# Patient Record
Sex: Male | Born: 1995 | Race: Black or African American | Hispanic: No | Marital: Single
Health system: Southern US, Community
[De-identification: ages and names within clinical notes are randomized; demographics above are authoritative.]

## PROBLEM LIST (undated history)

## (undated) DIAGNOSIS — N390 Urinary tract infection, site not specified: Secondary | ICD-10-CM

## (undated) DIAGNOSIS — Z789 Other specified health status: Secondary | ICD-10-CM

## (undated) DIAGNOSIS — R0600 Dyspnea, unspecified: Secondary | ICD-10-CM

## (undated) HISTORY — PX: WISDOM TOOTH EXTRACTION: SHX21

## (undated) HISTORY — DX: Urinary tract infection, site not specified: N39.0

---
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
## 2015-02-27 ENCOUNTER — Emergency Department (HOSPITAL_COMMUNITY)
Admission: EM | Admit: 2015-02-27 | Discharge: 2015-02-27 | Disposition: A | Discharge status: Home / Self Care | Payer: 59 | Attending: Emergency Medicine | Admitting: Emergency Medicine

## 2015-02-27 ENCOUNTER — Encounter (HOSPITAL_COMMUNITY): Payer: Self-pay | Admitting: Emergency Medicine

## 2015-02-27 DIAGNOSIS — R369 Urethral discharge, unspecified: Secondary | ICD-10-CM | POA: Insufficient documentation

## 2015-02-27 DIAGNOSIS — N5082 Scrotal pain: Secondary | ICD-10-CM

## 2015-02-27 DIAGNOSIS — N508 Other specified disorders of male genital organs: Secondary | ICD-10-CM | POA: Insufficient documentation

## 2015-02-27 LAB — URINALYSIS, ROUTINE W REFLEX MICROSCOPIC
BILIRUBIN URINE: NEGATIVE
GLUCOSE, UA: NEGATIVE mg/dL
HGB URINE DIPSTICK: NEGATIVE
KETONES UR: NEGATIVE mg/dL
Nitrite: NEGATIVE
PH: 5.5 (ref 5.0–8.0)
PROTEIN: NEGATIVE mg/dL
Specific Gravity, Urine: 1.015 (ref 1.005–1.030)
UROBILINOGEN UA: 1 mg/dL (ref 0.0–1.0)

## 2015-02-27 LAB — URINE MICROSCOPIC-ADD ON

## 2015-02-27 MED ORDER — CEFTRIAXONE SODIUM 250 MG IJ SOLR
250.0000 mg | Freq: Once | INTRAMUSCULAR | Status: AC
  Administered 2015-02-27: 250 mg via INTRAMUSCULAR
  Filled 2015-02-27: qty 250

## 2015-02-27 MED ORDER — AZITHROMYCIN 250 MG PO TABS
1000.0000 mg | ORAL_TABLET | Freq: Once | ORAL | Status: AC
  Administered 2015-02-27: 1000 mg via ORAL
  Filled 2015-02-27: qty 4

## 2015-02-27 NOTE — Discharge Instructions (Signed)
As discussed, it is important that you monitor your condition carefully, and do not hesitate to return here, or 2 hours urologist if you develop new, or concerning pain.  Please recall that there are additional tests being conducted, and that you'll be made aware of abnormal results.  If these tests are abnormal, it is important she follow up with her primary care physician for further evaluation and management.

## 2015-02-27 NOTE — ED Notes (Signed)
Pt in from home for eval of bilateral testical pain x3 days and white penile discharge with dysuria. Pt denies any swelling to testicles and reports using protection when sexually active. nad noted. Pt denies any abd pain.

## 2015-02-27 NOTE — ED Provider Notes (Signed)
CSN: [id number]     Arrival date & time 02/27/15  1140 History   First MD Initiated Contact with Patient 02/27/15 1142     Chief Complaint  Patient presents with  . Testicle Pain  . Penile Discharge     (Consider location/radiation/quality/duration/timing/severity/associated sxs/prior Treatment) HPI Patient presents with concern of 3 days of left-sided testicles soreness, new dysuria, discharge. Onset was subtle. Since onset symptoms of been persistent. No scrotal swelling, pain is moderate. No fever, chills, abdominal pain, nausea, vomiting, hematuria. No history of STD. Patient has unprotected oral intercourse, but protected vaginal intercourse.    History reviewed. No pertinent past medical history. History reviewed. No pertinent past surgical history. No family history on file. History  Substance Use Topics  . Smoking status: Never Smoker   . Smokeless tobacco: Not on file  . Alcohol Use: Yes     Comment: social    Review of Systems  Constitutional:       Per HPI, otherwise negative  HENT:       Per HPI, otherwise negative  Respiratory:       Per HPI, otherwise negative  Cardiovascular:       Per HPI, otherwise negative  Gastrointestinal: Negative for vomiting.  Endocrine: Negative for polyuria.       Negative aside from HPI  Genitourinary:       Neg aside from HPI   Musculoskeletal:       Per HPI, otherwise negative  Skin: Negative for color change and wound.  Neurological: Negative for syncope.      Allergies  Review of patient's allergies indicates no known allergies.  Home Medications   Prior to Admission medications   Not on File   BP 132/73 mmHg  Pulse 73  Temp(Src) 98.9 F (37.2 C) (Oral)  Resp 16  Ht 5\' 8"  (1.727 m)  Wt 140 lb (63.504 kg)  BMI 21.29 kg/m2  SpO2 100% Physical Exam  Constitutional: He is oriented to person, place, and time. He appears well-developed. No distress.  HENT:  Head: Normocephalic and atraumatic.  Eyes:  Conjunctivae and EOM are normal.  Cardiovascular: Normal rate and regular rhythm.   Pulmonary/Chest: Effort normal. No stridor. No respiratory distress.  Abdominal: He exhibits no distension. Hernia confirmed negative in the right inguinal area and confirmed negative in the left inguinal area.  Genitourinary:    Right testis shows no mass, no swelling and no tenderness. Left testis shows no mass, no swelling and no tenderness. No penile tenderness. No discharge found.  Musculoskeletal: He exhibits no edema.  Lymphadenopathy:       Right: Inguinal adenopathy present.       Left: Inguinal adenopathy present.  Neurological: He is alert and oriented to person, place, and time.  Skin: Skin is warm and dry.  Psychiatric: He has a normal mood and affect.  Nursing note and vitals reviewed.   ED Course  Procedures (including critical care time) Labs Review Labs Reviewed  URINALYSIS, ROUTINE W REFLEX MICROSCOPIC  RPR  HIV ANTIBODY (ROUTINE TESTING)  GC/CHLAMYDIA PROBE AMP ([place])      MDM  Patient presents with new left-sided scrotum soreness, dysuria, discharge. Patient received empiric therapy for gonorrhea and chlamydia, has pending syphilis and HIV tests. No evidence for torsion given the absence of pain with palpation, distress, substantial discomfort. Given the passage of 3 days since onset, patient can follow-up with urology for further evaluation and management as well.    Gerhard Munchobert Deklynn Charlet, MD 02/27/15 [id number]

## 2015-02-28 LAB — RPR: RPR: NONREACTIVE

## 2015-03-01 LAB — GC/CHLAMYDIA PROBE AMP (~~LOC~~) NOT AT ARMC
Chlamydia: NEGATIVE
NEISSERIA GONORRHEA: NEGATIVE

## 2015-03-02 LAB — HIV ANTIBODY (ROUTINE TESTING W REFLEX): HIV SCREEN 4TH GENERATION: NONREACTIVE

## 2019-07-16 ENCOUNTER — Emergency Department (HOSPITAL_COMMUNITY)
Admission: EM | Admit: 2019-07-16 | Discharge: 2019-07-17 | Disposition: A | Discharge status: Home / Self Care | Payer: Self-pay | Attending: Emergency Medicine | Admitting: Emergency Medicine

## 2019-07-16 ENCOUNTER — Emergency Department (HOSPITAL_COMMUNITY): Payer: Self-pay

## 2019-07-16 ENCOUNTER — Other Ambulatory Visit: Payer: Self-pay

## 2019-07-16 ENCOUNTER — Encounter (HOSPITAL_COMMUNITY): Payer: Self-pay | Admitting: Emergency Medicine

## 2019-07-16 DIAGNOSIS — Y903 Blood alcohol level of 60-79 mg/100 ml: Secondary | ICD-10-CM | POA: Insufficient documentation

## 2019-07-16 DIAGNOSIS — Y939 Activity, unspecified: Secondary | ICD-10-CM | POA: Insufficient documentation

## 2019-07-16 DIAGNOSIS — Z20828 Contact with and (suspected) exposure to other viral communicable diseases: Secondary | ICD-10-CM | POA: Insufficient documentation

## 2019-07-16 DIAGNOSIS — Z23 Encounter for immunization: Secondary | ICD-10-CM | POA: Insufficient documentation

## 2019-07-16 DIAGNOSIS — Y929 Unspecified place or not applicable: Secondary | ICD-10-CM | POA: Insufficient documentation

## 2019-07-16 DIAGNOSIS — S41112A Laceration without foreign body of left upper arm, initial encounter: Secondary | ICD-10-CM | POA: Insufficient documentation

## 2019-07-16 DIAGNOSIS — R202 Paresthesia of skin: Secondary | ICD-10-CM | POA: Insufficient documentation

## 2019-07-16 DIAGNOSIS — S51812A Laceration without foreign body of left forearm, initial encounter: Secondary | ICD-10-CM | POA: Insufficient documentation

## 2019-07-16 DIAGNOSIS — E876 Hypokalemia: Secondary | ICD-10-CM | POA: Insufficient documentation

## 2019-07-16 DIAGNOSIS — R200 Anesthesia of skin: Secondary | ICD-10-CM | POA: Insufficient documentation

## 2019-07-16 DIAGNOSIS — S21212A Laceration without foreign body of left back wall of thorax without penetration into thoracic cavity, initial encounter: Secondary | ICD-10-CM | POA: Insufficient documentation

## 2019-07-16 DIAGNOSIS — F10920 Alcohol use, unspecified with intoxication, uncomplicated: Secondary | ICD-10-CM | POA: Insufficient documentation

## 2019-07-16 DIAGNOSIS — Y999 Unspecified external cause status: Secondary | ICD-10-CM | POA: Insufficient documentation

## 2019-07-16 HISTORY — DX: Other specified health status: Z78.9

## 2019-07-16 LAB — ETHANOL: Alcohol, Ethyl (B): 77 mg/dL — ABNORMAL HIGH (ref <10)

## 2019-07-16 LAB — I-STAT CHEM 8, ED
BUN: 9 mg/dL (ref 6–20)
Calcium, Ion: 1.16 mmol/L (ref 1.15–1.40)
Chloride: 106 mmol/L (ref 98–111)
Creatinine, Ser: 1.2 mg/dL (ref 0.61–1.24)
Glucose, Bld: 92 mg/dL (ref 70–99)
HCT: 47 % (ref 39.0–52.0)
Hemoglobin: 16 g/dL (ref 13.0–17.0)
Potassium: 2.9 mmol/L — ABNORMAL LOW (ref 3.5–5.1)
Sodium: 143 mmol/L (ref 135–145)
TCO2: 16 mmol/L — ABNORMAL LOW (ref 22–32)

## 2019-07-16 LAB — BPAM RBC
Blood Product Expiration Date: 202009302359
Blood Product Expiration Date: 202009302359
ISSUE DATE / TIME: 202008262002
ISSUE DATE / TIME: 202008262002
Unit Type and Rh: 5100
Unit Type and Rh: 5100

## 2019-07-16 LAB — ABO/RH: ABO/RH(D): O POS

## 2019-07-16 LAB — TYPE AND SCREEN
ABO/RH(D): O POS
Antibody Screen: NEGATIVE
Unit division: 0
Unit division: 0

## 2019-07-16 LAB — BPAM FFP
Blood Product Expiration Date: 202008312359
Blood Product Expiration Date: 202008312359
ISSUE DATE / TIME: 202008262002
ISSUE DATE / TIME: 202008262002
Unit Type and Rh: 6200
Unit Type and Rh: 6200

## 2019-07-16 LAB — COMPREHENSIVE METABOLIC PANEL
ALT: 14 U/L (ref 0–44)
AST: 21 U/L (ref 15–41)
Albumin: 4.4 g/dL (ref 3.5–5.0)
Alkaline Phosphatase: 58 U/L (ref 38–126)
Anion gap: 21 — ABNORMAL HIGH (ref 5–15)
BUN: 9 mg/dL (ref 6–20)
CO2: 14 mmol/L — ABNORMAL LOW (ref 22–32)
Calcium: 9.4 mg/dL (ref 8.9–10.3)
Chloride: 107 mmol/L (ref 98–111)
Creatinine, Ser: 1.4 mg/dL — ABNORMAL HIGH (ref 0.61–1.24)
GFR calc Af Amer: >60 mL/min (ref >60)
GFR calc non Af Amer: >60 mL/min (ref >60)
Glucose, Bld: 102 mg/dL — ABNORMAL HIGH (ref 70–99)
Potassium: 3.1 mmol/L — ABNORMAL LOW (ref 3.5–5.1)
Sodium: 142 mmol/L (ref 135–145)
Total Bilirubin: 0.8 mg/dL (ref 0.3–1.2)
Total Protein: 7.4 g/dL (ref 6.5–8.1)

## 2019-07-16 LAB — CBC
HCT: 43.4 % (ref 39.0–52.0)
Hemoglobin: 14.5 g/dL (ref 13.0–17.0)
MCH: 30.4 pg (ref 26.0–34.0)
MCHC: 33.4 g/dL (ref 30.0–36.0)
MCV: 91 fL (ref 80.0–100.0)
Platelets: 246 10*3/uL (ref 150–400)
RBC: 4.77 MIL/uL (ref 4.22–5.81)
RDW: 12.8 % (ref 11.5–15.5)
WBC: 8.7 10*3/uL (ref 4.0–10.5)
nRBC: 0 % (ref 0.0–0.2)

## 2019-07-16 LAB — PREPARE FRESH FROZEN PLASMA: Unit division: 0

## 2019-07-16 LAB — LACTIC ACID, PLASMA: Lactic Acid, Venous: >11 mmol/L (ref 0.5–1.9)

## 2019-07-16 LAB — CDS SEROLOGY

## 2019-07-16 LAB — PROTIME-INR
INR: 1.1 (ref 0.8–1.2)
Prothrombin Time: 13.6 seconds (ref 11.4–15.2)

## 2019-07-16 MED ORDER — IOHEXOL 300 MG/ML  SOLN
100.0000 mL | Freq: Once | INTRAMUSCULAR | Status: AC | PRN
  Administered 2019-07-16: 100 mL via INTRAVENOUS

## 2019-07-16 MED ORDER — LIDOCAINE-EPINEPHRINE (PF) 2 %-1:200000 IJ SOLN
INTRAMUSCULAR | Status: AC
  Administered 2019-07-16: 23:00:00
  Filled 2019-07-16: qty 20

## 2019-07-16 MED ORDER — SODIUM CHLORIDE 0.9 % IV BOLUS
2000.0000 mL | Freq: Once | INTRAVENOUS | Status: AC
  Administered 2019-07-16: 2000 mL via INTRAVENOUS

## 2019-07-16 MED ORDER — CEPHALEXIN 500 MG PO CAPS: 500.0000 mg | ORAL_CAPSULE | Freq: Four times a day (QID) | ORAL | 0 refills | Status: AC

## 2019-07-16 MED ORDER — TETANUS-DIPHTH-ACELL PERTUSSIS 5-2.5-18.5 LF-MCG/0.5 IM SUSP
0.5000 mL | Freq: Once | INTRAMUSCULAR | Status: AC
  Administered 2019-07-16: 0.5 mL via INTRAMUSCULAR
  Filled 2019-07-16: qty 0.5

## 2019-07-16 MED ORDER — POTASSIUM CHLORIDE 10 MEQ/100ML IV SOLN
10.0000 meq | INTRAVENOUS | Status: AC
  Administered 2019-07-16 (×2): 10 meq via INTRAVENOUS
  Filled 2019-07-16 (×2): qty 100

## 2019-07-16 MED ORDER — FENTANYL CITRATE (PF) 100 MCG/2ML IJ SOLN
50.0000 ug | Freq: Once | INTRAMUSCULAR | Status: AC
  Administered 2019-07-16: 21:00:00 50 ug via INTRAVENOUS
  Filled 2019-07-16: qty 2

## 2019-07-16 MED ORDER — CEFAZOLIN SODIUM-DEXTROSE 1-4 GM/50ML-% IV SOLN
INTRAVENOUS | Status: AC | PRN
  Administered 2019-07-16: 1 g via INTRAVENOUS
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
## 2019-07-16 NOTE — Discharge Instructions (Signed)
Please call Dr. Lequita Asal office tomorrow morning to be seen in his clinic tomorrow.  Please do not use your left arm until further instructed by his office.  Please keep the splint clean and dry.  Please return to ER if you develop severe pain, no significant swelling or skin discoloration in your arm, difficulty breathing or other new concerning symptoms.  Recommend antibiotic to help prevent infection.  Discussed this with your surgeon tomorrow.

## 2019-07-16 NOTE — Progress Notes (Signed)
Orthopedic Tech Progress Note Patient Details:  Franklin Sparks July 09, 1996 [id number]  Ortho Devices Type of Ortho Device: Arm sling, Volar splint Ortho Device/Splint Location: lue long volar. applied as requested by the dr. Manson Passey Device/Splint Interventions: Ordered, Application, Adjustment   Post Interventions Patient Tolerated: Well Instructions Provided: Care of device, Adjustment of device   Karolee Stamps 07/16/2019, 11:48 PM

## 2019-07-17 ENCOUNTER — Encounter (HOSPITAL_COMMUNITY): Payer: Self-pay | Admitting: Emergency Medicine

## 2019-07-17 LAB — SARS CORONAVIRUS 2 (TAT 6-24 HRS): SARS Coronavirus 2: NEGATIVE

## 2019-07-17 NOTE — ED Notes (Signed)
All appropriate discharge materials reviewed with patient at length. Time for questions provided. Pt denies any further questions at this time. Verbalizes understanding of all provided materials.  

## 2019-07-18 ENCOUNTER — Other Ambulatory Visit: Payer: Self-pay

## 2019-07-18 ENCOUNTER — Encounter (HOSPITAL_COMMUNITY): Payer: Self-pay | Admitting: *Deleted
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
## 2019-07-18 NOTE — Anesthesia Preprocedure Evaluation (Signed)
Anesthesia Evaluation  Patient identified by MRN, date of birth, ID band Patient awake    Reviewed: Allergy & Precautions, NPO status , Patient's Chart, lab work & pertinent test results  Airway Mallampati: II  TM Distance: >3 FB Neck ROM: Full    Dental no notable dental hx.    Pulmonary neg pulmonary ROS,    Pulmonary exam normal breath sounds clear to auscultation       Cardiovascular negative cardio ROS Normal cardiovascular exam Rhythm:Regular Rate:Normal     Neuro/Psych negative neurological ROS  negative psych ROS   GI/Hepatic negative GI ROS, Neg liver ROS,   Endo/Other  negative endocrine ROS  Renal/GU negative Renal ROS     Musculoskeletal negative musculoskeletal ROS (+)   Abdominal   Peds  Hematology negative hematology ROS (+)   Anesthesia Other Findings   Reproductive/Obstetrics                             Anesthesia Physical Anesthesia Plan  ASA: I  Anesthesia Plan: General   Post-op Pain Management:    Induction: Intravenous  PONV Risk Score and Plan: 3 and Ondansetron, Dexamethasone, Treatment may vary due to age or medical condition and Midazolam  Airway Management Planned: LMA  Additional Equipment: None  Intra-op Plan:   Post-operative Plan: Extubation in OR  Informed Consent: I have reviewed the patients History and Physical, chart, labs and discussed the procedure including the risks, benefits and alternatives for the proposed anesthesia with the patient or authorized representative who has indicated his/her understanding and acceptance.     Dental advisory given  Plan Discussed with: CRNA  Anesthesia Plan Comments:         Anesthesia Quick Evaluation

## 2019-07-19 ENCOUNTER — Encounter (HOSPITAL_COMMUNITY)
Admission: RE | Disposition: A | Discharge status: Home / Self Care | Payer: Self-pay | Source: Home / Self Care | Attending: Orthopedic Surgery

## 2019-07-19 ENCOUNTER — Encounter (HOSPITAL_COMMUNITY): Payer: Self-pay | Admitting: *Deleted

## 2019-07-19 ENCOUNTER — Ambulatory Visit (HOSPITAL_COMMUNITY): Payer: Self-pay | Admitting: Certified Registered"

## 2019-07-19 ENCOUNTER — Ambulatory Visit (HOSPITAL_COMMUNITY)
Admission: RE | Admit: 2019-07-19 | Discharge: 2019-07-19 | Disposition: A | Discharge status: Home / Self Care | Payer: Self-pay | Attending: Orthopedic Surgery | Admitting: Orthopedic Surgery

## 2019-07-19 DIAGNOSIS — S56424A Laceration of extensor muscle, fascia and tendon of left middle finger at forearm level, initial encounter: Secondary | ICD-10-CM | POA: Insufficient documentation

## 2019-07-19 DIAGNOSIS — S56422A Laceration of extensor muscle, fascia and tendon of left index finger at forearm level, initial encounter: Secondary | ICD-10-CM | POA: Insufficient documentation

## 2019-07-19 DIAGNOSIS — S51802A Unspecified open wound of left forearm, initial encounter: Secondary | ICD-10-CM

## 2019-07-19 DIAGNOSIS — Y939 Activity, unspecified: Secondary | ICD-10-CM | POA: Insufficient documentation

## 2019-07-19 DIAGNOSIS — S56426A Laceration of extensor muscle, fascia and tendon of left ring finger at forearm level, initial encounter: Secondary | ICD-10-CM | POA: Insufficient documentation

## 2019-07-19 DIAGNOSIS — S56522A Laceration of other extensor muscle, fascia and tendon at forearm level, left arm, initial encounter: Secondary | ICD-10-CM

## 2019-07-19 HISTORY — PX: WOUND EXPLORATION: SHX6188

## 2019-07-19 HISTORY — DX: Dyspnea, unspecified: R06.00

## 2019-07-19 SURGERY — WOUND EXPLORATION
Anesthesia: General | Laterality: Left

## 2019-07-19 MED ORDER — CEFAZOLIN SODIUM-DEXTROSE 2-4 GM/100ML-% IV SOLN
2.0000 g | INTRAVENOUS | Status: AC
  Administered 2019-07-19: 2 g via INTRAVENOUS

## 2019-07-19 MED ORDER — BUPIVACAINE-EPINEPHRINE (PF) 0.25% -1:200000 IJ SOLN
INTRAMUSCULAR | Status: AC
  Filled 2019-07-19: qty 30

## 2019-07-19 MED ORDER — BUPIVACAINE HCL (PF) 0.25 % IJ SOLN
INTRAMUSCULAR | Status: AC
  Filled 2019-07-19: qty 30

## 2019-07-19 MED ORDER — FENTANYL CITRATE (PF) 250 MCG/5ML IJ SOLN
INTRAMUSCULAR | Status: DC | PRN
  Administered 2019-07-19: 100 ug via INTRAVENOUS
  Administered 2019-07-19: 50 ug via INTRAVENOUS

## 2019-07-19 MED ORDER — PROPOFOL 10 MG/ML IV BOLUS
INTRAVENOUS | Status: DC | PRN
  Administered 2019-07-19: 200 mg via INTRAVENOUS

## 2019-07-19 MED ORDER — CEFAZOLIN SODIUM-DEXTROSE 2-4 GM/100ML-% IV SOLN
INTRAVENOUS | Status: AC
  Filled 2019-07-19: qty 100

## 2019-07-19 MED ORDER — ONDANSETRON HCL 4 MG/2ML IJ SOLN
INTRAMUSCULAR | Status: DC | PRN
  Administered 2019-07-19: 4 mg via INTRAVENOUS

## 2019-07-19 MED ORDER — BUPIVACAINE HCL 0.25 % IJ SOLN
INTRAMUSCULAR | Status: DC | PRN
  Administered 2019-07-19: 10 mL

## 2019-07-19 MED ORDER — DEXAMETHASONE SODIUM PHOSPHATE 10 MG/ML IJ SOLN
INTRAMUSCULAR | Status: DC | PRN
  Administered 2019-07-19: 10 mg via INTRAVENOUS

## 2019-07-19 MED ORDER — PROPOFOL 10 MG/ML IV BOLUS
INTRAVENOUS | Status: AC
  Filled 2019-07-19: qty 20

## 2019-07-19 MED ORDER — MIDAZOLAM HCL 2 MG/2ML IJ SOLN
INTRAMUSCULAR | Status: AC
  Filled 2019-07-19: qty 2

## 2019-07-19 MED ORDER — FENTANYL CITRATE (PF) 250 MCG/5ML IJ SOLN
INTRAMUSCULAR | Status: AC
  Filled 2019-07-19: qty 5

## 2019-07-19 MED ORDER — HYDROMORPHONE HCL 1 MG/ML IJ SOLN: 0.2500 mg | INTRAMUSCULAR | Status: DC | PRN

## 2019-07-19 MED ORDER — PROMETHAZINE HCL 25 MG/ML IJ SOLN: 6.2500 mg | INTRAMUSCULAR | Status: DC | PRN

## 2019-07-19 MED ORDER — DEXMEDETOMIDINE HCL 200 MCG/2ML IV SOLN
INTRAVENOUS | Status: DC | PRN
  Administered 2019-07-19 (×3): 8 ug via INTRAVENOUS
  Administered 2019-07-19 (×2): 4 ug via INTRAVENOUS

## 2019-07-19 MED ORDER — MEPERIDINE HCL 25 MG/ML IJ SOLN: 6.2500 mg | INTRAMUSCULAR | Status: DC | PRN

## 2019-07-19 MED ORDER — MIDAZOLAM HCL 2 MG/2ML IJ SOLN
INTRAMUSCULAR | Status: DC | PRN
  Administered 2019-07-19: 2 mg via INTRAVENOUS

## 2019-07-19 MED ORDER — LACTATED RINGERS IV SOLN
INTRAVENOUS | Status: DC
  Administered 2019-07-19: 07:00:00 via INTRAVENOUS

## 2019-07-19 MED ORDER — 0.9 % SODIUM CHLORIDE (POUR BTL) OPTIME
TOPICAL | Status: DC | PRN
  Administered 2019-07-19: 1000 mL

## 2019-07-19 MED ORDER — CHLORHEXIDINE GLUCONATE 4 % EX LIQD: 60.0000 mL | Freq: Once | CUTANEOUS | Status: DC

## 2019-07-19 MED ORDER — LIDOCAINE 2% (20 MG/ML) 5 ML SYRINGE
INTRAMUSCULAR | Status: DC | PRN
  Administered 2019-07-19: 100 mg via INTRAVENOUS

## 2019-07-19 MED ORDER — BUPIVACAINE HCL 0.5 % IJ SOLN
INTRAMUSCULAR | Status: AC
  Filled 2019-07-19: qty 1

## 2019-07-19 MED ORDER — PHENYLEPHRINE HCL (PRESSORS) 10 MG/ML IV SOLN
INTRAVENOUS | Status: DC | PRN
  Administered 2019-07-19 (×3): 80 ug via INTRAVENOUS

## 2019-07-19 SURGICAL SUPPLY — 32 items
BNDG ELASTIC 4X5.8 VLCR STR LF (GAUZE/BANDAGES/DRESSINGS) ×3 IMPLANT
BNDG GAUZE ELAST 4 BULKY (GAUZE/BANDAGES/DRESSINGS) ×3 IMPLANT
COVER WAND RF STERILE (DRAPES) IMPLANT
CUFF TOURN SGL QUICK 18X4 (TOURNIQUET CUFF) ×3 IMPLANT
DRAPE SURG 17X11 SM STRL (DRAPES) ×3 IMPLANT
DRSG ADAPTIC 3X8 NADH LF (GAUZE/BANDAGES/DRESSINGS) ×3 IMPLANT
DRSG PAD ABDOMINAL 8X10 ST (GAUZE/BANDAGES/DRESSINGS) ×3 IMPLANT
ELECT REM PT RETURN 9FT ADLT (ELECTROSURGICAL) ×3
ELECTRODE REM PT RTRN 9FT ADLT (ELECTROSURGICAL) ×1 IMPLANT
GAUZE SPONGE 4X4 12PLY STRL (GAUZE/BANDAGES/DRESSINGS) ×3 IMPLANT
GLOVE SURG ORTHO 8.0 STRL STRW (GLOVE) ×3 IMPLANT
GOWN STRL REUS W/ TWL XL LVL3 (GOWN DISPOSABLE) ×1 IMPLANT
GOWN STRL REUS W/TWL XL LVL3 (GOWN DISPOSABLE) ×2
IV NS IRRIG 3000ML ARTHROMATIC (IV SOLUTION) IMPLANT
KIT BASIN OR (CUSTOM PROCEDURE TRAY) ×3 IMPLANT
MANIFOLD NEPTUNE II (INSTRUMENTS) IMPLANT
PACK ORTHO EXTREMITY (CUSTOM PROCEDURE TRAY) ×3 IMPLANT
PAD CAST 4YDX4 CTTN HI CHSV (CAST SUPPLIES) ×1 IMPLANT
PADDING CAST COTTON 4X4 STRL (CAST SUPPLIES) ×2
SET CYSTO W/LG BORE CLAMP LF (SET/KITS/TRAYS/PACK) ×3 IMPLANT
SOAP 2 % CHG 4 OZ (WOUND CARE) ×3 IMPLANT
SPLINT FIBERGLASS 3X35 (CAST SUPPLIES) ×3 IMPLANT
SUT MNCRL AB 3-0 PS2 18 (SUTURE) ×3 IMPLANT
SUT PROLENE 3 0 PS 2 (SUTURE) ×6 IMPLANT
SUT VIC AB 1 CT1 27 (SUTURE)
SUT VIC AB 1 CT1 27XBRD ANTBC (SUTURE) IMPLANT
SUT VIC AB 2-0 CT1 27 (SUTURE)
SUT VIC AB 2-0 CT1 27XBRD (SUTURE) IMPLANT
SUT VIC AB 2-0 FS1 27 (SUTURE) ×6 IMPLANT
SYR 20ML LL LF (SYRINGE) IMPLANT
SYR CONTROL 10ML LL (SYRINGE) ×6 IMPLANT
TOWEL GREEN STERILE (TOWEL DISPOSABLE) ×3 IMPLANT

## 2019-07-19 NOTE — Anesthesia Postprocedure Evaluation (Signed)
Anesthesia Post Note  Patient: Franklin Sparks  Procedure(s) Performed: LEFT FOREARM WOUND EXPLORATION AND TENDON REPAIR (Left )     Patient location during evaluation: PACU Anesthesia Type: General Level of consciousness: sedated and patient cooperative Pain management: pain level controlled Vital Signs Assessment: post-procedure vital signs reviewed and stable Respiratory status: spontaneous breathing Cardiovascular status: stable Anesthetic complications: no    Last Vitals:  Vitals:   07/19/19 0919 07/19/19 0933  BP: 120/61 137/76  Pulse: 72 78  Resp: 20 15  Temp:  36.4 C  SpO2: 97% 96%    Last Pain:  Vitals:   07/19/19 0933  TempSrc:   PainSc: 0-No pain                 Nolon Nations
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
## 2019-07-19 NOTE — Discharge Instructions (Signed)
KEEP BANDAGE CLEAN AND DRY °CALL OFFICE FOR F/U APPT [phone] °KEEP HAND ELEVATED ABOVE HEART °OK TO APPLY ICE TO OPERATIVE AREA °CONTACT OFFICE IF ANY WORSENING PAIN OR CONCERNS. °

## 2019-07-19 NOTE — Transfer of Care (Signed)
Immediate Anesthesia Transfer of Care Note  Patient: Franklin Sparks  Procedure(s) Performed: LEFT FOREARM WOUND EXPLORATION AND TENDON REPAIR (Left )  Patient Location: PACU  Anesthesia Type:General  Level of Consciousness: awake and oriented  Airway & Oxygen Therapy: Patient Spontanous Breathing and Patient connected to nasal cannula oxygen  Post-op Assessment: Report given to RN and Post -op Vital signs reviewed and stable  Post vital signs: Reviewed and stable  Last Vitals:  Vitals Value Taken Time  BP 112/53 07/19/19 0849  Temp    Pulse 78 07/19/19 0858  Resp 29 07/19/19 0858  SpO2 96 % 07/19/19 0858  Vitals shown include unvalidated device data.  Last Pain:  Vitals:   07/19/19 0627  TempSrc: Oral  PainSc: 5       Patients Stated Pain Goal: 5 (21/30/86 5784)  Complications: No apparent anesthesia complications

## 2019-07-19 NOTE — Anesthesia Procedure Notes (Signed)
Procedure Name: LMA Insertion Date/Time: 07/19/2019 7:45 AM Performed by: Clearnce Sorrel, CRNA Pre-anesthesia Checklist: Patient identified, Emergency Drugs available, Suction available, Patient being monitored and Timeout performed Patient Re-evaluated:Patient Re-evaluated prior to induction Oxygen Delivery Method: Circle system utilized Preoxygenation: Pre-oxygenation with 100% oxygen Induction Type: IV induction LMA: LMA inserted LMA Size: 4.0 Number of attempts: 1 Placement Confirmation: positive ETCO2 and breath sounds checked- equal and bilateral Tube secured with: Tape Dental Injury: Teeth and Oropharynx as per pre-operative assessment

## 2019-07-20 ENCOUNTER — Encounter (HOSPITAL_COMMUNITY): Payer: Self-pay | Admitting: Orthopedic Surgery
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
## 2021-10-27 ENCOUNTER — Encounter (HOSPITAL_COMMUNITY): Payer: Self-pay | Admitting: *Deleted

## 2021-10-27 ENCOUNTER — Emergency Department (HOSPITAL_COMMUNITY)
Admission: EM | Admit: 2021-10-27 | Discharge: 2021-10-27 | Disposition: A | Discharge status: Home / Self Care | Payer: Medicaid Other | Attending: Emergency Medicine | Admitting: Emergency Medicine

## 2021-10-27 DIAGNOSIS — Z20822 Contact with and (suspected) exposure to covid-19: Secondary | ICD-10-CM | POA: Insufficient documentation

## 2021-10-27 DIAGNOSIS — Z87891 Personal history of nicotine dependence: Secondary | ICD-10-CM | POA: Insufficient documentation

## 2021-10-27 DIAGNOSIS — J101 Influenza due to other identified influenza virus with other respiratory manifestations: Secondary | ICD-10-CM | POA: Insufficient documentation

## 2021-10-27 LAB — RESP PANEL BY RT-PCR (FLU A&B, COVID) ARPGX2
Influenza A by PCR: POSITIVE — AB
Influenza B by PCR: NEGATIVE
SARS Coronavirus 2 by RT PCR: NEGATIVE

## 2021-10-27 NOTE — ED Notes (Signed)
ED Provider at bedside. 

## 2021-10-27 NOTE — ED Triage Notes (Signed)
Flu symptoms x 3 days
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
## 2022-07-03 ENCOUNTER — Emergency Department (HOSPITAL_COMMUNITY)
Admission: EM | Admit: 2022-07-03 | Discharge: 2022-07-03 | Disposition: A | Discharge status: Home / Self Care | Payer: Self-pay | Attending: Emergency Medicine | Admitting: Emergency Medicine

## 2022-07-03 ENCOUNTER — Other Ambulatory Visit: Payer: Self-pay

## 2022-07-03 ENCOUNTER — Encounter (HOSPITAL_COMMUNITY): Payer: Self-pay | Admitting: Emergency Medicine

## 2022-07-03 DIAGNOSIS — N4889 Other specified disorders of penis: Secondary | ICD-10-CM | POA: Insufficient documentation

## 2022-07-03 MED ORDER — DOXYCYCLINE HYCLATE 100 MG PO CAPS: 100.0000 mg | ORAL_CAPSULE | Freq: Two times a day (BID) | ORAL | 0 refills | Status: DC

## 2022-07-03 NOTE — ED Triage Notes (Signed)
Pt has bump to head of penis that's has open and drained, causing pain.

## 2022-07-03 NOTE — Discharge Instructions (Signed)
Please return to the ED with any new symptoms such as painful urination, discharge from the penis, fevers Please begin taking antibiotics I placed you on.  You will take these for the next 5 days twice daily. Please follow-up with your PCP and please continue to use safe sex practices.

## 2022-07-03 NOTE — ED Provider Notes (Signed)
[facility] EMERGENCY DEPARTMENT Provider Note   CSN: [id number] Arrival date & time: 07/03/22  1339     History  Chief Complaint  Patient presents with   Abscess    Franklin Sparks is a 26 y.o. male with medical history significant for dyspnea.  The patient presents to the ED for evaluation of abscess to his penis.  The patient states that ever since he was born, ever since he could remember, he has had a cyst/abscess on the side of his penis.  The patient reports that for the last 27 years the cyst/abscess does not cause any issues for him.  The patient states that he has been seen for this issue before by other doctors as a kid, was told not to worry about it.  The patient states that 2 days ago this area on his penis opened up and began draining a clear fluid.  Patient states since this time the area on his penis has become progressively more painful.  The patient states that he has been monogamous relationship and has been sober for 5 years.  The patient states that his partner is not experiencing any symptoms.  The patient denies any penile discharge, fevers, dysuria, nausea or vomiting, testicular or scrotal pain.   Abscess Associated symptoms: no fever, no nausea and no vomiting        Home Medications Prior to Admission medications   Medication Sig Start Date End Date Taking? Authorizing Provider  doxycycline (VIBRAMYCIN) 100 MG capsule Take 1 capsule (100 mg total) by mouth 2 (two) times daily. 07/03/22  Yes Al Decant, PA-C      Allergies    Patient has no known allergies.    Review of Systems   Review of Systems  Constitutional:  Negative for fever.  Gastrointestinal:  Negative for nausea and vomiting.  Genitourinary:  Negative for dysuria, penile discharge, penile pain, scrotal swelling and testicular pain.  Skin:  Positive for wound.  All other systems reviewed and are negative.   Physical Exam Updated Vital Signs BP (!) 145/103   Pulse 67    Temp 98.6 F (37 C) (Oral)   Resp 17   Ht 5\' 8"  (1.727 m)   Wt 65.8 kg   SpO2 100%   BMI 22.05 kg/m  Physical Exam Vitals and nursing note reviewed. Exam conducted with a chaperone present.  Constitutional:      General: He is not in acute distress.    Appearance: Normal appearance. He is not ill-appearing, toxic-appearing or diaphoretic.  HENT:     Head: Normocephalic and atraumatic.     Nose: Nose normal. No congestion.     Mouth/Throat:     Mouth: Mucous membranes are moist.     Pharynx: Oropharynx is clear.  Eyes:     Extraocular Movements: Extraocular movements intact.     Conjunctiva/sclera: Conjunctivae normal.     Pupils: Pupils are equal, round, and reactive to light.  Cardiovascular:     Rate and Rhythm: Normal rate and regular rhythm.  Pulmonary:     Effort: Pulmonary effort is normal.     Breath sounds: Normal breath sounds. No wheezing.  Abdominal:     General: Abdomen is flat. Bowel sounds are normal.     Palpations: Abdomen is soft.     Tenderness: There is no abdominal tenderness.  Genitourinary:   Musculoskeletal:     Cervical back: Normal range of motion and neck supple. No tenderness.  Skin:    General:  Skin is warm and dry.     Capillary Refill: Capillary refill takes less than 2 seconds.  Neurological:     Mental Status: He is alert and oriented to person, place, and time.     ED Results / Procedures / Treatments   Labs (all labs ordered are listed, but only abnormal results are displayed) Labs Reviewed - No data to display  EKG None  Radiology No results found.  Procedures Procedures   Medications Ordered in ED Medications - No data to display  ED Course/ Medical Decision Making/ A&P                           Medical Decision Making  26 year old male presents to the ED for evaluation.  Please see HPI for further details.  On examination, patient is a 1 cm nonfluctuating area without erythema or induration to the lateral  portion of the left side of his penis.  There is a sinus hole present without drainage.  The area in question is not tender.  The patient denies any concern for STI, denies any recent new sexual exposures.  The patient states that this area has been present on his penis since he was born.  Due to the fact this area already drained spontaneously, there is no incision and drainage needed.  This area does not appear to be a primary chancre, doubt syphilis.  The patient denies any dysuria, penile discharge, new sexual contacts so I doubt STI at this time.  Due to the fact that this patient states that this area has been present on his penis since he was born, low concern for STI.  Patient will be placed on 5 days of doxycycline twice daily and advised to follow-up with his PCP.  Patient advised to return for any new symptoms such as penile discharge, dysuria, new lesions.  Patient voices understanding of these instructions.  Patient had all of his questions answered to his satisfaction prior to discharge.  The patient is stable at this time for discharge home.  Final Clinical Impression(s) / ED Diagnoses Final diagnoses:  Penile cyst    Rx / DC Orders ED Discharge Orders          Ordered    doxycycline (VIBRAMYCIN) 100 MG capsule  2 times daily        07/03/22 1808              Al Decant, PA-C 07/03/22 1808    Gloris Manchester, MD 07/06/22 [phone]

## 2022-07-27 ENCOUNTER — Encounter (HOSPITAL_COMMUNITY): Payer: Self-pay | Admitting: Emergency Medicine

## 2022-07-27 ENCOUNTER — Other Ambulatory Visit: Payer: Self-pay

## 2022-07-27 ENCOUNTER — Emergency Department (HOSPITAL_COMMUNITY)
Admission: EM | Admit: 2022-07-27 | Discharge: 2022-07-27 | Disposition: A | Discharge status: Home / Self Care | Payer: Self-pay | Attending: Emergency Medicine | Admitting: Emergency Medicine

## 2022-07-27 DIAGNOSIS — R369 Urethral discharge, unspecified: Secondary | ICD-10-CM | POA: Insufficient documentation

## 2022-07-27 LAB — URINALYSIS, ROUTINE W REFLEX MICROSCOPIC
Bilirubin Urine: NEGATIVE
Glucose, UA: NEGATIVE mg/dL
Hgb urine dipstick: NEGATIVE
Ketones, ur: NEGATIVE mg/dL
Nitrite: NEGATIVE
Protein, ur: NEGATIVE mg/dL
Specific Gravity, Urine: 1.016 (ref 1.005–1.030)
pH: 7 (ref 5.0–8.0)

## 2022-07-27 MED ORDER — LIDOCAINE HCL (PF) 1 % IJ SOLN
INTRAMUSCULAR | Status: AC
  Administered 2022-07-27: 1 mL via INTRAMUSCULAR
  Filled 2022-07-27: qty 2

## 2022-07-27 MED ORDER — DOXYCYCLINE HYCLATE 100 MG PO CAPS: 100.0000 mg | ORAL_CAPSULE | Freq: Two times a day (BID) | ORAL | 0 refills | Status: AC

## 2022-07-27 MED ORDER — CEFTRIAXONE SODIUM 500 MG IJ SOLR
500.0000 mg | Freq: Once | INTRAMUSCULAR | Status: AC
  Administered 2022-07-27: 500 mg via INTRAMUSCULAR
  Filled 2022-07-27: qty 500

## 2022-07-27 NOTE — ED Provider Notes (Signed)
[facility] EMERGENCY DEPARTMENT Provider Note   CSN: [id number] Arrival date & time: 07/27/22  1634     History  Chief Complaint  Patient presents with   Groin Swelling    Franklin Sparks is a 26 y.o. male.  26 year old male who is sexually active presents today for evaluation of lesion to his penis.  This was present last week when he was evaluated in the emergency room.  Now he is also having penile discharge.  He is sexually active most recently 1 month ago.  States he was unable to fully complete the doxycycline course that was prescribed to him.  Denies testicular pain, scrotal swelling, penile pain, dysuria.  The history is provided by the patient. No language interpreter was used.       Home Medications Prior to Admission medications   Medication Sig Start Date End Date Taking? Authorizing Provider  doxycycline (VIBRAMYCIN) 100 MG capsule Take 1 capsule (100 mg total) by mouth 2 (two) times daily. 07/03/22   Al Decant, PA-C      Allergies    Patient has no known allergies.    Review of Systems   Review of Systems  Constitutional:  Negative for fever.  Genitourinary:  Positive for penile discharge. Negative for dysuria, flank pain, penile pain, penile swelling, scrotal swelling and testicular pain.  All other systems reviewed and are negative.   Physical Exam Updated Vital Signs BP 119/84 (BP Location: Left Arm)   Pulse 62   Temp 98 F (36.7 C) (Oral)   Resp 19   Ht 5\' 8"  (1.727 m)   Wt 65.8 kg   SpO2 100%   BMI 22.06 kg/m  Physical Exam Vitals and nursing note reviewed. Exam conducted with a chaperone present.     ED Results / Procedures / Treatments   Labs (all labs ordered are listed, but only abnormal results are displayed) Labs Reviewed  URINALYSIS, ROUTINE W REFLEX MICROSCOPIC  GC/CHLAMYDIA PROBE AMP ([place]) NOT AT [facility]    EKG None  Radiology No results found.  Procedures Procedures    Medications Ordered in  ED Medications  cefTRIAXone (ROCEPHIN) injection 500 mg (has no administration in time range)    ED Course/ Medical Decision Making/ A&P                           Medical Decision Making Amount and/or Complexity of Data Reviewed Labs: ordered.  Risk Prescription drug management.   26 year old male returns today for persistent penile discharge.  He was treated for a cyst on his penis last week with 5-day course of doxycycline.  He states he had improvement initially however he had emesis and could not complete the course.  He states he had 2 days left.  He did take 2 additional days following the emesis without additional difficulty tolerating the antibiotic.  He states he gave 3-4 additional days after completing the antibiotic without resolution of his symptoms.  Denies any new lesions, without testicular pain, or scrotal swelling.  Will obtain UA, gonorrhea and chlamydia.  Will empirically treat for STI.  Patient is in agreement with this.  Return precautions discussed.  Discussed follow-up with PCP. Due to lab experiencing staff shortage.  UA has not resulted yet.  We will follow-up on this but patient is appropriate for discharge.  Follow-up information for , health and wellness clinic provided.  UA with leukocytes and 21-50 WBCs.  Will order urine culture.  Given he is without dysuria will not treat for UTI.   Final Clinical Impression(s) / ED Diagnoses Final diagnoses:  Penile discharge    Rx / DC Orders ED Discharge Orders          Ordered    doxycycline (VIBRAMYCIN) 100 MG capsule  2 times daily        07/27/22 2143              Marita [place], PA-C 07/27/22 2242    Mardene Sayer, MD 07/28/22 [phone]

## 2022-07-27 NOTE — Discharge Instructions (Signed)
We obtained a urine sample today.  We also collected gonorrhea chlamydia.  These will take 2 to 3 days to result.  Received dose of antibiotic in the emergency room.  I have sent additional robotic into the pharmacy for you.  For any concerning symptoms return to the emergency room if you do not have a primary care provider have attached information for you above to establish care with [facility].

## 2022-07-27 NOTE — ED Triage Notes (Signed)
Pt presents with bump to penis, was treated at this ED, but ABT made him vomit.

## 2022-07-29 LAB — URINE CULTURE: Culture: NO GROWTH

## 2022-07-31 LAB — GC/CHLAMYDIA PROBE AMP (~~LOC~~) NOT AT ARMC
Chlamydia: NEGATIVE
Comment: NEGATIVE
Comment: NORMAL
Neisseria Gonorrhea: NEGATIVE

## 2022-09-12 ENCOUNTER — Other Ambulatory Visit: Payer: Self-pay

## 2022-09-12 ENCOUNTER — Emergency Department (HOSPITAL_COMMUNITY)
Admission: EM | Admit: 2022-09-12 | Discharge: 2022-09-12 | Disposition: A | Discharge status: Home / Self Care | Payer: Self-pay | Attending: Emergency Medicine | Admitting: Emergency Medicine

## 2022-09-12 ENCOUNTER — Encounter (HOSPITAL_COMMUNITY): Payer: Self-pay | Admitting: Emergency Medicine

## 2022-09-12 DIAGNOSIS — R369 Urethral discharge, unspecified: Secondary | ICD-10-CM | POA: Insufficient documentation

## 2022-09-12 DIAGNOSIS — R3911 Hesitancy of micturition: Secondary | ICD-10-CM | POA: Insufficient documentation

## 2022-09-12 DIAGNOSIS — R350 Frequency of micturition: Secondary | ICD-10-CM | POA: Insufficient documentation

## 2022-09-12 LAB — URINALYSIS, ROUTINE W REFLEX MICROSCOPIC
Bacteria, UA: NONE SEEN
Bilirubin Urine: NEGATIVE
Glucose, UA: NEGATIVE mg/dL
Hgb urine dipstick: NEGATIVE
Ketones, ur: NEGATIVE mg/dL
Nitrite: NEGATIVE
Protein, ur: NEGATIVE mg/dL
Specific Gravity, Urine: 1.012 (ref 1.005–1.030)
pH: 6 (ref 5.0–8.0)

## 2022-09-12 MED ORDER — TAMSULOSIN HCL 0.4 MG PO CAPS: 0.4000 mg | ORAL_CAPSULE | Freq: Every day | ORAL | 0 refills | Status: AC

## 2022-09-12 NOTE — ED Notes (Addendum)
Per PA obtain post void residual bladder scan. Patient ambulated to restroom with steady gait. Franklin Corona [name]

## 2022-09-12 NOTE — ED Triage Notes (Signed)
Pt was given abx for UTI and finished but pt still has urinary freq. Nad. Clear dc from penis

## 2022-09-12 NOTE — Discharge Instructions (Signed)
Please call the urology group listed to arrange a follow-up appointment.  Take the medication as directed as this may help you to empty your bladder more completely.

## 2022-09-13 NOTE — ED Provider Notes (Signed)
[facility] EMERGENCY DEPARTMENT Provider Note   CSN: [id number] Arrival date & time: 09/12/22  1649     History  Chief Complaint  Patient presents with   Urinary Frequency    Franklin Sparks is a 26 y.o. male.   Urinary Frequency Pertinent negatives include no abdominal pain.       Franklin Sparks is a 26 y.o. male who presents to the Emergency Department requesting evaluation for urinary hesitancy.  He was seen here few weeks ago for penile discharge and admits to not taking the medications as directed, but did take all of it.  Notes having a sensation of not completely emptying his bladder and voiding small amounts.  Continues to have a slight clear discharge from his penis.  He denies fever, chills, abdominal pain, and pain, swelling or rash of his penis or testicles.    Home Medications Prior to Admission medications   Medication Sig Start Date End Date Taking? Authorizing Provider  tamsulosin (FLOMAX) 0.4 MG CAPS capsule Take 1 capsule (0.4 mg total) by mouth daily. 09/12/22  Yes Brylie Sneath, PA-C      Allergies    Patient has no known allergies.    Review of Systems   Review of Systems  Constitutional:  Negative for appetite change and fever.  Respiratory:  Negative for cough.   Gastrointestinal:  Negative for abdominal pain, constipation, diarrhea, nausea and vomiting.  Genitourinary:  Positive for difficulty urinating, frequency and penile discharge. Negative for dysuria, flank pain, genital sores, hematuria, penile swelling, scrotal swelling and testicular pain.  Musculoskeletal:  Negative for back pain.  Skin:  Negative for color change and rash.  Neurological:  Negative for weakness and numbness.    Physical Exam Updated Vital Signs BP 114/82 (BP Location: Right Arm)   Pulse 60   Temp (!) 97.3 F (36.3 C) (Oral)   Resp 16   SpO2 96%  Physical Exam Vitals and nursing note reviewed.  Constitutional:      General: He is not in acute  distress.    Appearance: Normal appearance.  Cardiovascular:     Rate and Rhythm: Normal rate and regular rhythm.     Pulses: Normal pulses.  Pulmonary:     Effort: Pulmonary effort is normal.     Breath sounds: Normal breath sounds.  Abdominal:     General: There is no distension.     Palpations: Abdomen is soft.     Tenderness: There is no abdominal tenderness. There is no guarding.  Musculoskeletal:        General: Normal range of motion.  Skin:    Capillary Refill: Capillary refill takes less than 2 seconds.  Neurological:     General: No focal deficit present.     Mental Status: He is alert.     Sensory: No sensory deficit.     Motor: No weakness.     ED Results / Procedures / Treatments   Labs (all labs ordered are listed, but only abnormal results are displayed) Labs Reviewed  URINALYSIS, ROUTINE W REFLEX MICROSCOPIC - Abnormal; Notable for the following components:      Result Value   Leukocytes,Ua SMALL (*)    All other components within normal limits    EKG None  Radiology No results found.  Procedures Procedures    Medications Ordered in ED Medications - No data to display  ED Course/ Medical Decision Making/ A&P  Medical Decision Making Pt returns here for evaluation of urinary hesitancy and continued clear discharge from his penis.  He was seen here last month for penile d/c and prescribed doxycycline which he completed, but did not take as directed.  He denies abd pain, rash of his genitals, pain and swelling of his penis or testicles.    On exam pt well appearing.  Vitals reviewed, abd is soft and non tender,  urinating here w/o pain or difficulty.   Post void residual bladder scan revealed 180 cc remaining.  Amount and/or Complexity of Data Reviewed Labs: ordered.    Details: Urinalysis w/o hematuria or evidence of infection Discussion of management or test interpretation with external provider(s): Pt had work up last  month for penile d/c took abx but did not take as directed.  Continues to have symptoms. Did not f/u with PCP or urology.  Doubt emergent process. Had negative STI testing on previous visit.    Appropriate for d/c home, rx given for Flomax. I have recommended close urology f/u      Risk Prescription drug management.           Final Clinical Impression(s) / ED Diagnoses Final diagnoses:  Urinary hesitancy    Rx / DC Orders ED Discharge Orders          Ordered    tamsulosin (FLOMAX) 0.4 MG CAPS capsule  Daily        09/12/22 2018              Kem Parkinson, PA-C 09/13/22 2225    Wyvonnia Dusky, MD 09/14/22 1038
